# Patient Record
Sex: Female | Born: 1970 | Race: White | Hispanic: No | Marital: Married | Smoking: Former smoker
Health system: Southern US, Community
[De-identification: ages and names within clinical notes are randomized; demographics above are authoritative.]

## PROBLEM LIST (undated history)

## (undated) DIAGNOSIS — I10 Essential (primary) hypertension: Secondary | ICD-10-CM

## (undated) HISTORY — PX: KNEE SURGERY: SHX244

---

## 2007-08-18 ENCOUNTER — Ambulatory Visit: Payer: Self-pay | Admitting: Family Medicine

## 2009-05-12 IMAGING — CT CT HEAD WITHOUT AND WITH CONTRAST
1 of 2 series · 13 of 30 positions shown, 17 images · non-contrast
Comparison: none

REASON FOR EXAM: Weakness
COMMENTS:

[Series 2: soft tissue wo · axial · 0.40mm/px · z∈[+686,+806]mm · 13 of 29 slices shown, 17 images]
[im 3/29  brain]
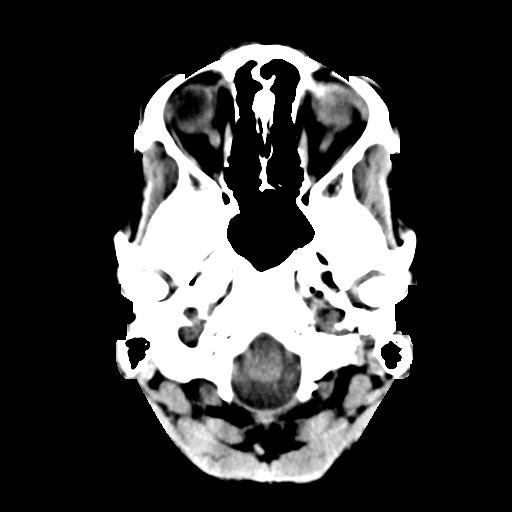
[im 3/29  bone]
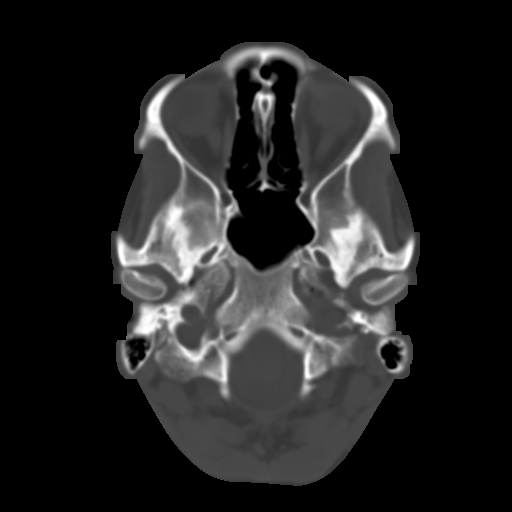
[im 5/29  brain]
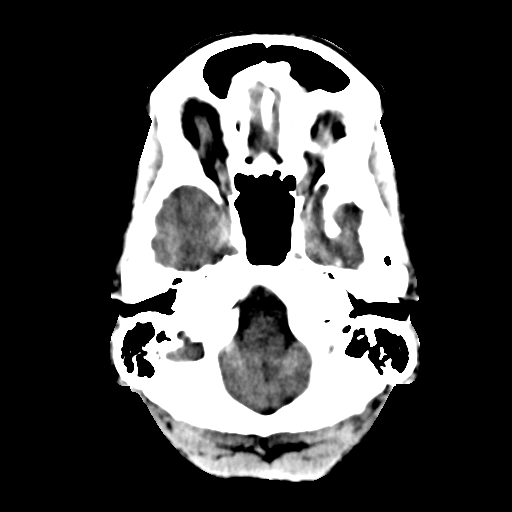
[im 7/29  brain]
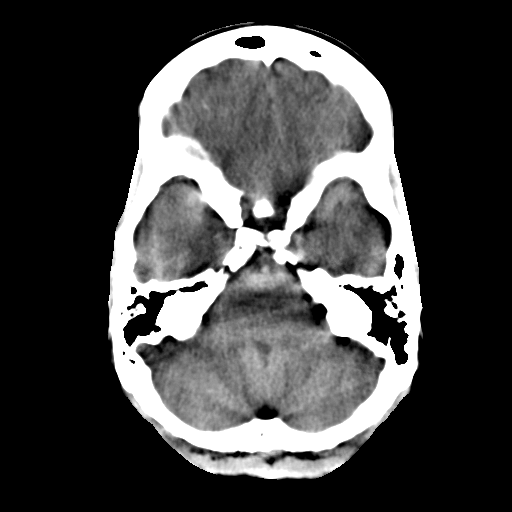
[im 9/29  brain]
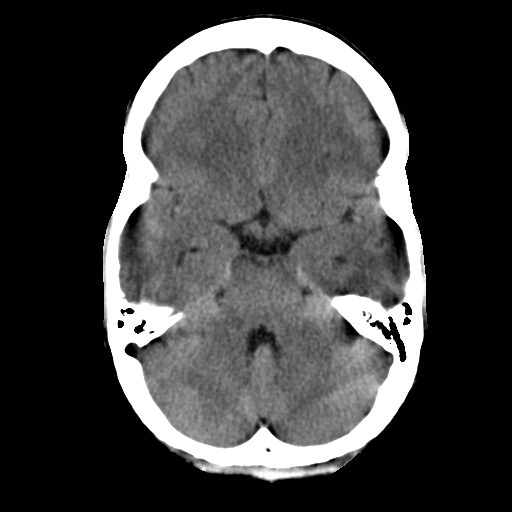
[im 11/29  brain]
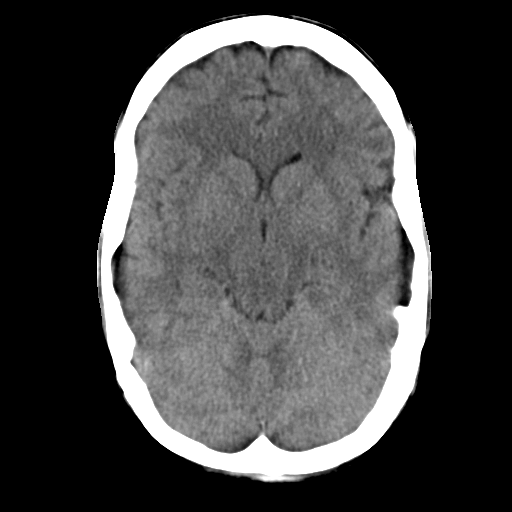
[im 11/29  bone]
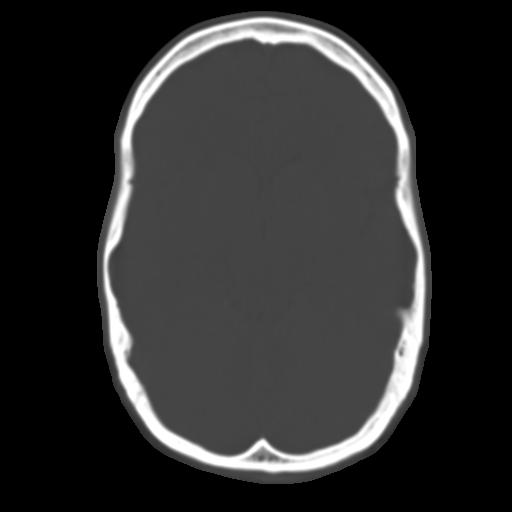
[im 13/29  brain]
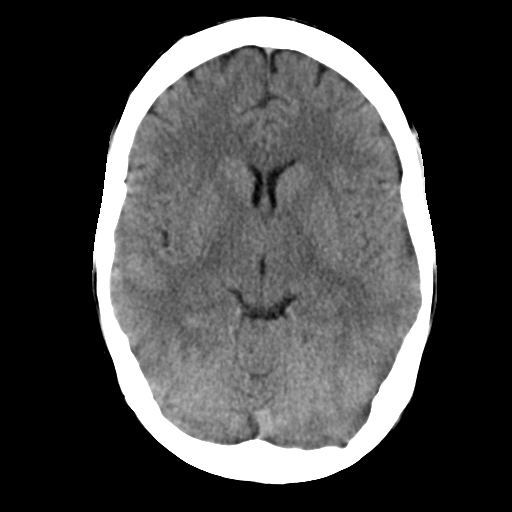
[im 15/29  brain]
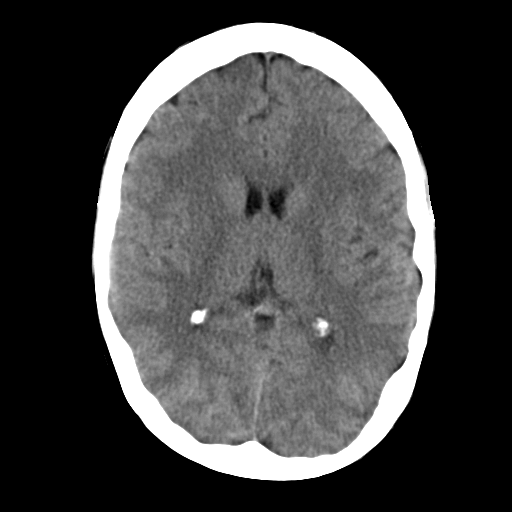
[im 17/29  brain]
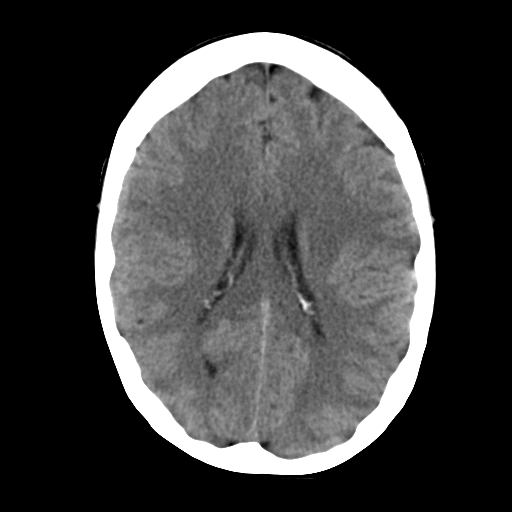
[im 19/29  brain]
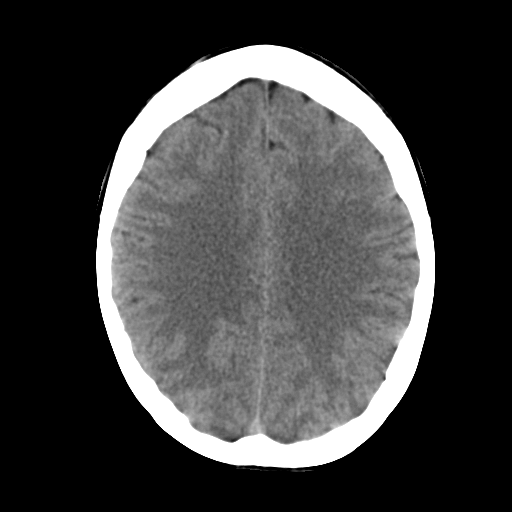
[im 19/29  bone]
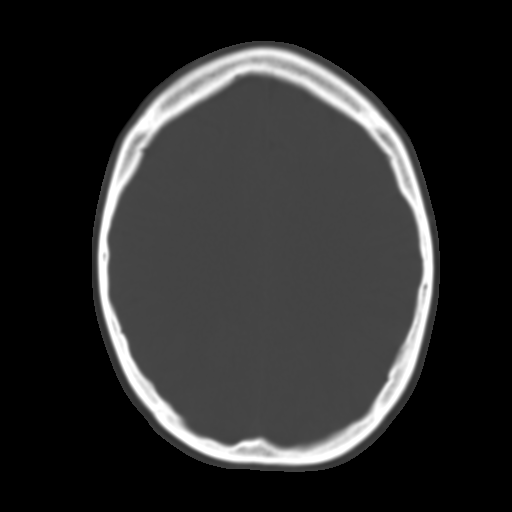
[im 21/29  brain]
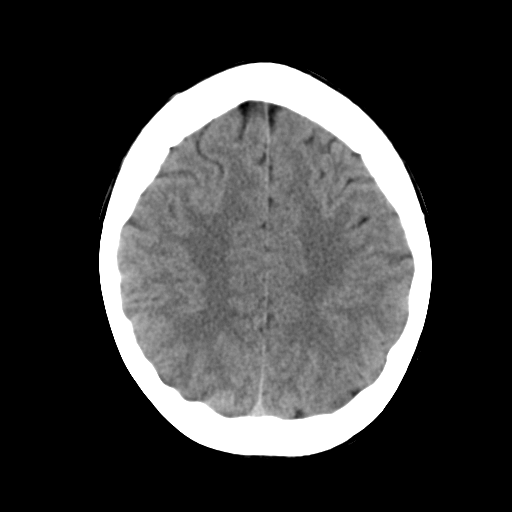
[im 23/29  brain]
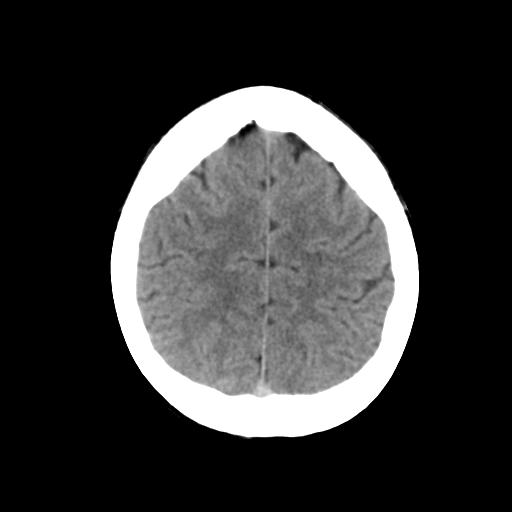
[im 25/29  brain]
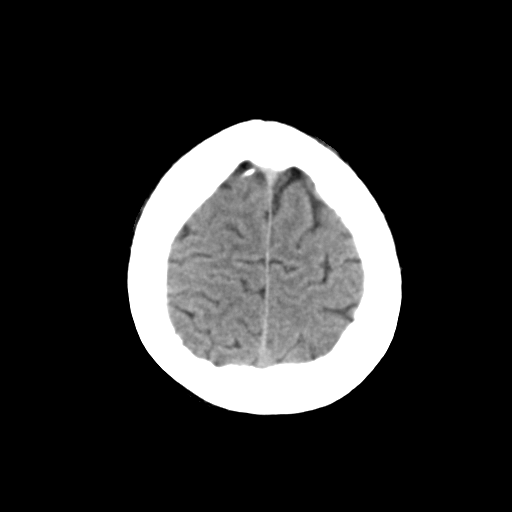
[im 27/29  brain]
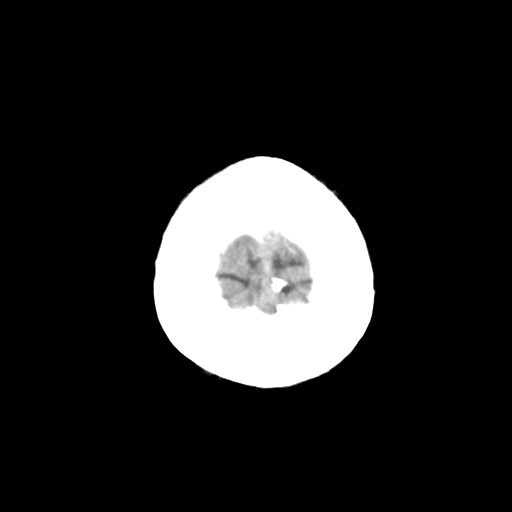
[im 27/29  bone]
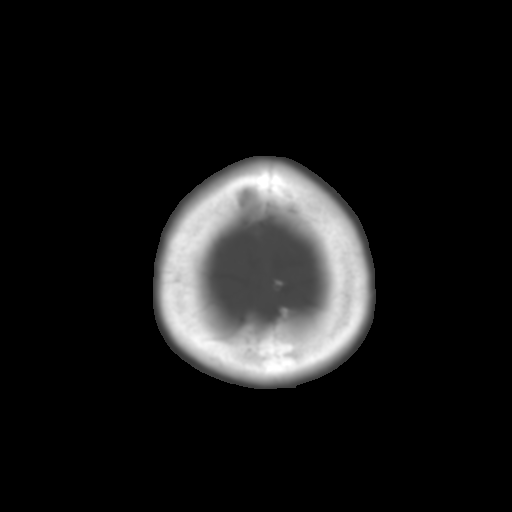

[13 of 30 positions shown; findings below may reference images not displayed]

PROCEDURE:     SOHI - AYA EL NAH/BELLAL  - August 18, 2007  [DATE]

RESULT:     Non-contrast and contrast-enhanced scans were performed. On the
noncontrast images the ventricles are normal in size and position. There is
no intracranial hemorrhage, mass, or mass-effect. The cerebellum and
brainstem exhibit normal density. There are no findings to suggest an
evolving ischemic infarction.

Following intravenous administration of 50 ml of Isovue 300, the enhancement
pattern of the brain parenchyma is normal. At bone window settings the
observed portions of the paranasal sinuses are clear. The mastoid air cells
are well pneumatized. There is no evidence of a skull fracture.
IMPRESSION: Normal non-contrast and contrast-enhanced CT scan of the
brain.

## 2010-07-30 DIAGNOSIS — F329 Major depressive disorder, single episode, unspecified: Secondary | ICD-10-CM | POA: Insufficient documentation

## 2010-07-30 DIAGNOSIS — F988 Other specified behavioral and emotional disorders with onset usually occurring in childhood and adolescence: Secondary | ICD-10-CM | POA: Insufficient documentation

## 2010-07-30 DIAGNOSIS — F411 Generalized anxiety disorder: Secondary | ICD-10-CM | POA: Insufficient documentation

## 2013-09-27 ENCOUNTER — Ambulatory Visit: Payer: Self-pay | Admitting: Emergency Medicine

## 2013-09-29 DIAGNOSIS — I10 Essential (primary) hypertension: Secondary | ICD-10-CM | POA: Insufficient documentation

## 2014-08-26 DIAGNOSIS — Z859 Personal history of malignant neoplasm, unspecified: Secondary | ICD-10-CM | POA: Insufficient documentation

## 2014-08-29 DIAGNOSIS — Z7141 Alcohol abuse counseling and surveillance of alcoholic: Secondary | ICD-10-CM | POA: Insufficient documentation

## 2015-01-02 ENCOUNTER — Other Ambulatory Visit: Payer: Self-pay | Admitting: Family Medicine

## 2015-01-02 DIAGNOSIS — Z1231 Encounter for screening mammogram for malignant neoplasm of breast: Secondary | ICD-10-CM

## 2015-01-05 ENCOUNTER — Ambulatory Visit: Payer: Self-pay | Attending: Family Medicine

## 2015-04-18 ENCOUNTER — Encounter: Payer: Self-pay | Admitting: *Deleted

## 2015-04-18 ENCOUNTER — Ambulatory Visit
Admission: EM | Admit: 2015-04-18 | Discharge: 2015-04-18 | Disposition: A | Discharge status: Home / Self Care | Payer: No Typology Code available for payment source | Attending: Family Medicine | Admitting: Family Medicine

## 2015-04-18 DIAGNOSIS — J0100 Acute maxillary sinusitis, unspecified: Secondary | ICD-10-CM | POA: Diagnosis not present

## 2015-04-18 HISTORY — DX: Essential (primary) hypertension: I10

## 2015-04-18 MED ORDER — AMOXICILLIN 875 MG PO TABS: 875.0000 mg | ORAL_TABLET | Freq: Two times a day (BID) | ORAL | Status: DC

## 2015-04-18 NOTE — ED Notes (Signed)
C/o head and chest congestion and pressure sensation in her ears along with tinnitus x3 weeks. Also has at times had shortness of breath and had a sore throat.

## 2015-04-18 NOTE — ED Provider Notes (Signed)
CSN: [id number]     Arrival date & time 04/18/15  [id number] History   First MD Initiated Contact with Patient 04/18/15 [phone]     Chief Complaint  Patient presents with  . Nasal Congestion  . Shortness of Breath  . Tinnitus   (Consider location/radiation/quality/duration/timing/severity/associated sxs/prior Treatment) Patient is a 45 y.o. female presenting with shortness of breath and URI. The history is provided by the patient.  Shortness of Breath Associated symptoms: cough and headaches   Associated symptoms: no wheezing   URI Presenting symptoms: congestion, cough and facial pain   Severity:  Moderate Onset quality:  Sudden Duration:  3 weeks Timing:  Constant Progression:  Worsening Chronicity:  New Relieved by:  Nothing Ineffective treatments:  OTC medications Associated symptoms: headaches and sinus pain   Associated symptoms: no wheezing   Risk factors: not elderly, no chronic cardiac disease, no chronic kidney disease, no chronic respiratory disease, no diabetes mellitus, no immunosuppression, no recent illness, no recent travel and no sick contacts     Past Medical History  Diagnosis Date  . Hypertension    History reviewed. No pertinent past surgical history. History reviewed. No pertinent family history. Social History  Substance Use Topics  . Smoking status: Former Games developermoker  . Smokeless tobacco: None  . Alcohol Use: No   OB History    No data available     Review of Systems  HENT: Positive for congestion.   Respiratory: Positive for cough and shortness of breath. Negative for wheezing.   Neurological: Positive for headaches.    Allergies  Review of patient's allergies indicates no known allergies.  Home Medications   Prior to Admission medications   Medication Sig Start Date End Date Taking? Authorizing Provider  amLODipine (NORVASC) 10 MG tablet Take 10 mg by mouth daily.   Yes Historical Provider, MD  amphetamine-dextroamphetamine (ADDERALL) 20 MG  tablet Take 20 mg by mouth daily.   Yes Historical Provider, MD  lisinopril (PRINIVIL,ZESTRIL) 20 MG tablet Take 20 mg by mouth daily.   Yes Historical Provider, MD  venlafaxine (EFFEXOR) 37.5 MG tablet Take 37.5 mg by mouth 2 (two) times daily.   Yes Historical Provider, MD  amoxicillin (AMOXIL) 875 MG tablet Take 1 tablet (875 mg total) by mouth 2 (two) times daily. 04/18/15   Payton Mccallumrlando Affan Callow, MD   Meds Ordered and Administered this Visit  Medications - No data to display  BP 122/82 mmHg  Pulse 102  Temp(Src) 98.5 F (36.9 C) (Oral)  Resp 16  Ht 5\' 4"  (1.626 m)  Wt 160 lb (72.576 kg)  BMI 27.45 kg/m2  SpO2 99% No data found.   Physical Exam  Constitutional: She appears well-developed and well-nourished. No distress.  HENT:  Head: Normocephalic and atraumatic.  Right Ear: Tympanic membrane, external ear and ear canal normal.  Left Ear: Tympanic membrane, external ear and ear canal normal.  Nose: Mucosal edema and rhinorrhea present. No nose lacerations, sinus tenderness, nasal deformity, septal deviation or nasal septal hematoma. No epistaxis.  No foreign bodies. Right sinus exhibits maxillary sinus tenderness and frontal sinus tenderness. Left sinus exhibits maxillary sinus tenderness and frontal sinus tenderness.  Mouth/Throat: Uvula is midline, oropharynx is clear and moist and mucous membranes are normal. No oropharyngeal exudate.  Eyes: Conjunctivae and EOM are normal. Pupils are equal, round, and reactive to light. Right eye exhibits no discharge. Left eye exhibits no discharge. No scleral icterus.  Neck: Normal range of motion. Neck supple. No thyromegaly present.  Cardiovascular: Normal  rate, regular rhythm and normal heart sounds.   Pulmonary/Chest: Effort normal and breath sounds normal. No respiratory distress. She has no wheezes. She has no rales.  Lymphadenopathy:    She has no cervical adenopathy.  Skin: She is not diaphoretic.  Nursing note and vitals reviewed.   ED  Course  Procedures (including critical care time)  Labs Review Labs Reviewed - No data to display  Imaging Review No results found.   Visual Acuity Review  Right Eye Distance:   Left Eye Distance:   Bilateral Distance:    Right Eye Near:   Left Eye Near:    Bilateral Near:         MDM   1. Acute maxillary sinusitis, recurrence not specified    New Prescriptions   AMOXICILLIN (AMOXIL) 875 MG TABLET    Take 1 tablet (875 mg total) by mouth 2 (two) times daily.     1.diagnosis reviewed with patient 2. rx as per orders above; reviewed possible side effects, interactions, risks and benefits  3. Recommend supportive treatment with rest, increased fluids, otc analgesics 4. Follow-up prn if symptoms worsen or don't improve  Payton Mccallum, MD 04/18/15 [phone]

## 2016-01-16 DIAGNOSIS — B001 Herpesviral vesicular dermatitis: Secondary | ICD-10-CM | POA: Insufficient documentation

## 2017-09-14 ENCOUNTER — Ambulatory Visit
Admission: EM | Admit: 2017-09-14 | Discharge: 2017-09-14 | Disposition: A | Discharge status: Home / Self Care | Payer: 59 | Attending: Internal Medicine | Admitting: Internal Medicine

## 2017-09-14 ENCOUNTER — Other Ambulatory Visit: Payer: Self-pay

## 2017-09-14 DIAGNOSIS — N3090 Cystitis, unspecified without hematuria: Secondary | ICD-10-CM

## 2017-09-14 LAB — URINALYSIS, COMPLETE (UACMP) WITH MICROSCOPIC
Bilirubin Urine: NEGATIVE
Glucose, UA: NEGATIVE mg/dL
Hgb urine dipstick: NEGATIVE
Ketones, ur: NEGATIVE mg/dL
Nitrite: NEGATIVE
Protein, ur: NEGATIVE mg/dL
Specific Gravity, Urine: 1.015 (ref 1.005–1.030)
pH: 8.5 — ABNORMAL HIGH (ref 5.0–8.0)

## 2017-09-14 MED ORDER — SULFAMETHOXAZOLE-TRIMETHOPRIM 800-160 MG PO TABS: 1.0000 | ORAL_TABLET | Freq: Two times a day (BID) | ORAL | 0 refills | Status: AC

## 2017-09-14 MED ORDER — FLUCONAZOLE 150 MG PO TABS: 150.0000 mg | ORAL_TABLET | Freq: Every day | ORAL | 1 refills | Status: DC

## 2017-09-14 NOTE — ED Provider Notes (Signed)
[facility]    CSN: [id number] Arrival date & time: 09/14/17  1221     History   Chief Complaint Chief Complaint  Patient presents with  . Urinary Frequency    HPI Lindsey Vasquez is a 47 y.o. female.   Pt c/o urinary frequency and back pain she describes like menstrual aches. Occasional brief dysuria x10 days. Denies fever, nausea, vomiting or diarrhea     Past Medical History:  Diagnosis Date  . Hypertension     There are no active problems to display for this patient.   Past Surgical History:  Procedure Laterality Date  . CESAREAN SECTION  10/1992  . KNEE SURGERY Left     OB History   None      Home Medications    Prior to Admission medications   Medication Sig Start Date End Date Taking? Authorizing Provider  amphetamine-dextroamphetamine (ADDERALL) 20 MG tablet Take 20 mg by mouth daily.   Yes [provider]  hydrochlorothiazide (MICROZIDE) 12.5 MG capsule Take by mouth. 05/14/16 08/09/18 Yes [provider]  venlafaxine (EFFEXOR) 37.5 MG tablet Take 37.5 mg by mouth 2 (two) times daily.   Yes [provider]  amLODipine (NORVASC) 10 MG tablet Take 10 mg by mouth daily.    [provider]  amoxicillin (AMOXIL) 875 MG tablet Take 1 tablet (875 mg total) by mouth 2 (two) times daily. 04/18/15   Payton Mccallumonty, [place], MD  fluconazole (DIFLUCAN) 150 MG tablet Take 1 tablet (150 mg total) by mouth daily. Take 1 tab before your course of antibiotics, then 1 tab upon completion of antibiotics 09/14/17   Arnaldo Nataliamond, Abie Killian S, MD  lisinopril (PRINIVIL,ZESTRIL) 20 MG tablet Take 20 mg by mouth daily.    [provider]  sulfamethoxazole-trimethoprim (BACTRIM DS,SEPTRA DS) 800-160 MG tablet Take 1 tablet by mouth 2 (two) times daily for 3 days. 09/14/17 09/17/17  Arnaldo Nataliamond, Monserrath Junio S, MD    Family History History reviewed. No pertinent family history.  Social History Social History   Tobacco Use  . Smoking status:  Former Games developermoker  . Smokeless tobacco: Never Used  Substance Use Topics  . Alcohol use: No  . Drug use: Not Currently     Allergies   Patient has no known allergies.   Review of Systems Review of Systems  Constitutional: Negative for chills and fever.  HENT: Negative for sore throat and tinnitus.   Eyes: Negative for redness.  Respiratory: Negative for cough and shortness of breath.   Cardiovascular: Negative for chest pain and palpitations.  Gastrointestinal: Negative for abdominal pain, diarrhea, nausea and vomiting.  Genitourinary: Positive for frequency. Negative for dysuria and urgency.  Musculoskeletal: Negative for myalgias.  Skin: Negative for rash.       No lesions  Neurological: Negative for weakness.  Hematological: Does not bruise/bleed easily.  Psychiatric/Behavioral: Negative for suicidal ideas.     Physical Exam Triage Vital Signs ED Triage Vitals  Enc Vitals Group     BP 09/14/17 1243 (!) 140/102     Pulse Rate 09/14/17 1243 76     Resp --      Temp 09/14/17 1243 98.6 F (37 C)     Temp Source 09/14/17 1243 Oral     SpO2 09/14/17 1243 97 %     Weight 09/14/17 1240 170 lb (77.1 kg)     Height 09/14/17 1240 5\' 3"  (1.6 m)     Head Circumference --      Peak Flow --  Pain Score 09/14/17 1239 5     Pain Loc --      Pain Edu? --      Excl. in GC? --    No data found.  Updated Vital Signs BP (!) 140/102 (BP Location: Right Arm)   Pulse 76   Temp 98.6 F (37 C) (Oral)   Ht 5\' 3"  (1.6 m)   Wt 77.1 kg   SpO2 97%   BMI 30.11 kg/m   Visual Acuity Right Eye Distance:   Left Eye Distance:   Bilateral Distance:    Right Eye Near:   Left Eye Near:    Bilateral Near:     Physical Exam  Constitutional: She is oriented to person, place, and time. She appears well-developed and well-nourished. No distress.  HENT:  Head: Normocephalic and atraumatic.  Mouth/Throat: Oropharynx is clear and moist.  Eyes: Pupils are equal, round, and reactive to  light. Conjunctivae and EOM are normal. No scleral icterus.  Neck: Normal range of motion. Neck supple. No JVD present. No tracheal deviation present. No thyromegaly present.  Cardiovascular: Normal rate, regular rhythm and normal heart sounds. Exam reveals no gallop and no friction rub.  No murmur heard. Pulmonary/Chest: Effort normal and breath sounds normal.  Abdominal: Soft. Bowel sounds are normal. She exhibits no distension. There is no tenderness.  No CVA tenderness  Musculoskeletal: Normal range of motion. She exhibits no edema.  Lymphadenopathy:    She has no cervical adenopathy.  Neurological: She is alert and oriented to person, place, and time. No cranial nerve deficit.  Skin: Skin is warm and dry.  Psychiatric: She has a normal mood and affect. Her behavior is normal. Judgment and thought content normal.  Nursing note and vitals reviewed.    UC Treatments / Results  Labs (all labs ordered are listed, but only abnormal results are displayed) Labs Reviewed  URINALYSIS, COMPLETE (UACMP) WITH MICROSCOPIC - Abnormal; Notable for the following components:      Result Value   pH 8.5 (*)    Leukocytes, UA TRACE (*)    Bacteria, UA RARE (*)    All other components within normal limits    EKG None  Radiology No results found.  Procedures Procedures (including critical care time)  Medications Ordered in UC Medications - No data to display  Initial Impression / Assessment and Plan / UC Course  I have reviewed the triage vital signs and the nursing notes.  Pertinent labs & imaging results that were available during my care of the patient were reviewed by me and considered in my medical decision making (see chart for details).     Clinically UTI although UA is not impressive.   Final Clinical Impressions(s) / UC Diagnoses   Final diagnoses:  Cystitis   Discharge Instructions   None    ED Prescriptions    Medication Sig Dispense Auth. Provider    sulfamethoxazole-trimethoprim (BACTRIM DS,SEPTRA DS) 800-160 MG tablet Take 1 tablet by mouth 2 (two) times daily for 3 days. 6 tablet Arnaldo Nataliamond, Heather Streeper S, MD   fluconazole (DIFLUCAN) 150 MG tablet Take 1 tablet (150 mg total) by mouth daily. Take 1 tab before your course of antibiotics, then 1 tab upon completion of antibiotics 2 tablet Arnaldo Nataliamond, Tanner Vigna S, MD     Controlled Substance Prescriptions [place] Controlled Substance Registry consulted? Not Applicable   Arnaldo Nataliamond, Zakery Normington S, MD 09/14/17 1424

## 2017-09-14 NOTE — ED Triage Notes (Signed)
Patient complains of urinary frequency, urgency, burning with urination and low back pain x 10 days.

## 2017-11-03 ENCOUNTER — Other Ambulatory Visit: Payer: Self-pay | Admitting: Obstetrics and Gynecology

## 2017-11-03 DIAGNOSIS — Z1231 Encounter for screening mammogram for malignant neoplasm of breast: Secondary | ICD-10-CM

## 2017-11-12 ENCOUNTER — Inpatient Hospital Stay: Admission: RE | Admit: 2017-11-12 | Payer: 59 | Source: Ambulatory Visit

## 2020-12-29 ENCOUNTER — Other Ambulatory Visit: Payer: Self-pay

## 2020-12-29 ENCOUNTER — Ambulatory Visit
Admission: EM | Admit: 2020-12-29 | Discharge: 2020-12-29 | Disposition: A | Discharge status: Home / Self Care | Payer: No Typology Code available for payment source | Attending: Emergency Medicine | Admitting: Emergency Medicine

## 2020-12-29 DIAGNOSIS — U071 COVID-19: Secondary | ICD-10-CM | POA: Diagnosis not present

## 2020-12-29 MED ORDER — LIDOCAINE VISCOUS HCL 2 % MT SOLN: 10.0000 mL | OROMUCOSAL | 0 refills | Status: DC | PRN

## 2020-12-29 NOTE — ED Triage Notes (Signed)
Pt here with C/O testing positive at home with Covid yesterday. Returned from [place] on Wednesday started having a sore throat Wednesday morning and has since got worst, cough, sore throat.

## 2020-12-29 NOTE — ED Provider Notes (Signed)
[facility]  ____________________________________________  Time seen: Approximately 9:37 AM  I have reviewed the triage vital signs and the nursing notes.   HISTORY  Chief Complaint Covid Positive    HPI Lindsey Vasquez is a 50 y.o. female who presents the emergency department complaining of nasal congestion, sore throat, cough, body aches.  No emesis or diarrhea.  Patient states that she developed symptoms 2 days ago, took an at home COVID test which was positive.  Patient just traveled from another state.  Patient is requesting symptom control medication at this time.  No neck pain or stiffness, chest pain, shortness of breath.       Past Medical History:  Diagnosis Date   Hypertension     There are no problems to display for this patient.   Past Surgical History:  Procedure Laterality Date   CESAREAN SECTION  10/1992   KNEE SURGERY Left     Prior to Admission medications   Medication Sig Start Date End Date Taking? Authorizing Provider  amLODipine (NORVASC) 10 MG tablet Take 10 mg by mouth daily.   Yes [provider]  amphetamine-dextroamphetamine (ADDERALL) 20 MG tablet Take 20 mg by mouth daily.   Yes [provider]  hydrochlorothiazide (MICROZIDE) 12.5 MG capsule Take by mouth. 05/14/16 12/29/20 Yes [provider]  lidocaine (XYLOCAINE) 2 % solution Use as directed 10 mLs in the mouth or throat every 4 (four) hours as needed for mouth pain. Gargle then swallow 12/29/20  Yes Khayden Herzberg, Delorise Royals, PA-C  venlafaxine (EFFEXOR) 37.5 MG tablet Take 37.5 mg by mouth 2 (two) times daily.   Yes [provider]  amoxicillin (AMOXIL) 875 MG tablet Take 1 tablet (875 mg total) by mouth 2 (two) times daily. 04/18/15   Payton Mccallum, MD  fluconazole (DIFLUCAN) 150 MG tablet Take 1 tablet (150 mg total) by mouth daily. Take 1 tab before your course of antibiotics, then 1 tab upon completion of antibiotics 09/14/17   Arnaldo Natal, MD   lisinopril (PRINIVIL,ZESTRIL) 20 MG tablet Take 20 mg by mouth daily.    [provider]    Allergies Patient has no known allergies.  History reviewed. No pertinent family history.  Social History Social History   Tobacco Use   Smoking status: Former   Smokeless tobacco: Never  Building services engineer Use: Never used  Substance Use Topics   Alcohol use: No   Drug use: Not Currently     Review of Systems  Constitutional: Positive fever/chills.  Positive for body aches Eyes: No visual changes. No discharge ENT: Positive for nasal congestion and sore throat Cardiovascular: no chest pain. Respiratory: Positive cough. No SOB. Gastrointestinal: No abdominal pain.  No nausea, no vomiting.  No diarrhea.  No constipation. Musculoskeletal: Negative for musculoskeletal pain. Skin: Negative for rash, abrasions, lacerations, ecchymosis. Neurological: Negative for headaches, focal weakness or numbness.  10 System ROS otherwise negative.  ____________________________________________   PHYSICAL EXAM:  VITAL SIGNS: ED Triage Vitals  Enc Vitals Group     BP 12/29/20 0901 125/86     Pulse Rate 12/29/20 0901 80     Resp 12/29/20 0901 18     Temp 12/29/20 0901 98.9 F (37.2 C)     Temp Source 12/29/20 0901 Oral     SpO2 12/29/20 0901 98 %     Weight 12/29/20 0900 190 lb (86.2 kg)     Height 12/29/20 0900 5\' 4"  (1.626 m)     Head Circumference --  Peak Flow --      Pain Score 12/29/20 0859 8     Pain Loc --      Pain Edu? --      Excl. in GC? --      Constitutional: Alert and oriented. Well appearing and in no acute distress. Eyes: Conjunctivae are normal. PERRL. EOMI. Head: Atraumatic. ENT:      Ears: EACs and TMs unremarkable bilaterally      Nose: Mild clear congestion/rhinnorhea.      Mouth/Throat: Mucous membranes are moist.  Slight erythema in the oropharynx but no edema.  No exudates.  Uvula is midline Neck: No stridor.  Neck is supple full range of  motion.  No tenderness on exam. Hematological/Lymphatic/Immunilogical: No cervical lymphadenopathy. Cardiovascular: Normal rate, regular rhythm. Normal S1 and S2.  Good peripheral circulation. Respiratory: Normal respiratory effort without tachypnea or retractions. Lungs CTAB. Good air entry to the bases with no decreased or absent breath sounds. Musculoskeletal: Full range of motion to all extremities. No gross deformities appreciated. Neurologic:  Normal speech and language. No gross focal neurologic deficits are appreciated.  Skin:  Skin is warm, dry and intact. No rash noted. Psychiatric: Mood and affect are normal. Speech and behavior are normal. Patient exhibits appropriate insight and judgement.   ____________________________________________   LABS (all labs ordered are listed, but only abnormal results are displayed)  Labs Reviewed - No data to display ____________________________________________  EKG   ____________________________________________  RADIOLOGY   No results found.  ____________________________________________    PROCEDURES  Procedure(s) performed:    Procedures    Medications - No data to display   ____________________________________________   INITIAL IMPRESSION / ASSESSMENT AND PLAN / ED COURSE  Pertinent labs & imaging results that were available during my care of the patient were reviewed by me and considered in my medical decision making (see chart for details).  Review of the [place] CSRS was performed in accordance of the [facility] prior to dispensing any controlled drugs.           Patient's diagnosis is consistent with COVID-19.  Patient presents urgent care with at home positive COVID test.  Patient is using over-the-counter medications.  Discussed medications with the patient.  She declines antiviral maxillary prescription which I feel is reasonable.  Discussed additional symptom control.  Patient is primarily looking for something for  her sore throat.  Will prescribe viscous lidocaine for her.  Return precautions discussed with the patient.  Otherwise follow-up primary care..  Patient is given ED precautions to return to the ED for any worsening or new symptoms.     ____________________________________________  FINAL CLINICAL IMPRESSION(S) / DIAGNOSES  Final diagnoses:  COVID-19      NEW MEDICATIONS STARTED DURING THIS VISIT:  ED Discharge Orders          Ordered    lidocaine (XYLOCAINE) 2 % solution  Every 4 hours PRN        12/29/20 0948                This chart was dictated using voice recognition software/Dragon. Despite best efforts to proofread, errors can occur which can change the meaning. Any change was purely unintentional.    Racheal Patches, PA-C 12/29/20 [phone]

## 2022-09-09 DIAGNOSIS — N951 Menopausal and female climacteric states: Secondary | ICD-10-CM | POA: Insufficient documentation

## 2024-02-18 ENCOUNTER — Ambulatory Visit: Admission: RE | Admit: 2024-02-18 | Discharge: 2024-02-18 | Disposition: A | Discharge status: Home / Self Care

## 2024-02-18 VITALS — BP 120/82 | HR 76 | Temp 97.7°F | Resp 19 | Wt 154.5 lb

## 2024-02-18 DIAGNOSIS — I10 Essential (primary) hypertension: Secondary | ICD-10-CM

## 2024-02-18 DIAGNOSIS — F419 Anxiety disorder, unspecified: Secondary | ICD-10-CM | POA: Diagnosis not present

## 2024-02-18 DIAGNOSIS — Z760 Encounter for issue of repeat prescription: Secondary | ICD-10-CM

## 2024-02-18 MED ORDER — VENLAFAXINE HCL ER 75 MG PO CP24: 75.0000 mg | ORAL_CAPSULE | Freq: Every day | ORAL | 1 refills | Status: AC

## 2024-02-18 MED ORDER — HYDROCHLOROTHIAZIDE 12.5 MG PO CAPS: 12.5000 mg | ORAL_CAPSULE | Freq: Every day | ORAL | 1 refills | Status: AC

## 2024-02-18 MED ORDER — AMLODIPINE BESYLATE 5 MG PO TABS: 5.0000 mg | ORAL_TABLET | Freq: Every day | ORAL | 1 refills | Status: AC

## 2024-02-18 NOTE — ED Provider Notes (Signed)
 " [facility]    CSN: [id number] Arrival date & time: 02/18/24  1411      History   Chief Complaint Chief Complaint  Patient presents with   Medication Refill    I have prescriptions for hypertension that I need a refill on as I wait for an appointment with a general practitioner in the area. - Entered by patient    HPI Lindsey Vasquez is a 54 y.o. female presenting for multiple medication refills including amlodipine , hydrochlorothiazide  and venlafaxine . She has been on venlafaxine  for over 10 years and says it manages her anxiety well. Patient last seen by PCP 6 months ago.  States her insurance recently changed and she needs to change medical groups.  States that will be a couple months until she can see a PCP in the area.  Blood pressure has been normal when checked at home.  It is 120/82 today.  No concerns.  Patient doing well.  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      Home Medications    Prior to Admission medications  Medication Sig Start Date End Date Taking? Authorizing Provider  amLODipine  (NORVASC ) 5 MG tablet Take 1 tablet (5 mg total) by mouth daily. 02/18/24  Yes Arvis Jolan NOVAK, PA-C  estradiol (ESTRACE) 1 MG tablet Take 1 mg by mouth. 08/28/23  Yes [provider]  hydrochlorothiazide  (MICROZIDE ) 12.5 MG capsule Take 1 capsule (12.5 mg total) by mouth daily. 02/18/24  Yes Arvis Jolan NOVAK, PA-C  venlafaxine  XR  (EFFEXOR  XR) 75 MG 24 hr capsule Take 1 capsule (75 mg total) by mouth daily with breakfast. 02/18/24  Yes Arvis Jolan B, PA-C  amphetamine-dextroamphetamine (ADDERALL) 20 MG tablet Take 20 mg by mouth daily.    [provider]  progesterone (PROMETRIUM) 100 MG capsule Take 100 mg by mouth daily.    [provider]    Family History History reviewed. No pertinent family history.  Social History Social History[1]   Allergies   Patient has no known allergies.   Review of Systems Review of Systems  Constitutional:  Negative for fatigue.  Respiratory:  Negative for shortness of breath.   Cardiovascular:  Negative for chest pain and palpitations.  Neurological:  Negative for dizziness, weakness, numbness and headaches.  Psychiatric/Behavioral:  Negative for dysphoric mood. The patient is not nervous/anxious.      Physical Exam Triage Vital Signs ED Triage Vitals  Encounter Vitals Group     BP      Girls Systolic BP Percentile      Girls Diastolic BP Percentile      Boys Systolic BP Percentile      Boys Diastolic BP Percentile      Pulse      Resp      Temp      Temp src      SpO2      Weight  Height      Head Circumference      Peak Flow      Pain Score      Pain Loc      Pain Education      Exclude from Growth Chart    No data found.  Updated Vital Signs BP 120/82   Pulse 76   Temp 97.7 F (36.5 C)   Resp 19   Wt 154 lb 8 oz (70.1 kg)   SpO2 97%   BMI 26.52 kg/m      Physical Exam Vitals and nursing note reviewed.  Constitutional:      General: She is not in acute distress.    Appearance: Normal appearance. She is not ill-appearing or toxic-appearing.  HENT:     Head: Normocephalic and atraumatic.  Eyes:     General: No scleral icterus.       Right eye: No discharge.        Left eye: No discharge.     Conjunctiva/sclera: Conjunctivae normal.  Cardiovascular:     Rate and Rhythm: Normal rate and regular rhythm.     Heart  sounds: Normal heart sounds.  Pulmonary:     Effort: Pulmonary effort is normal. No respiratory distress.     Breath sounds: Normal breath sounds.  Musculoskeletal:     Cervical back: Neck supple.  Skin:    General: Skin is dry.  Neurological:     General: No focal deficit present.     Mental Status: She is alert. Mental status is at baseline.     Motor: No weakness.     Gait: Gait normal.  Psychiatric:        Mood and Affect: Mood normal.        Behavior: Behavior normal.      UC Treatments / Results  Labs (all labs ordered are listed, but only abnormal results are displayed) Labs Reviewed - No data to display  EKG   Radiology No results found.  Procedures Procedures (including critical care time)  Medications Ordered in UC Medications - No data to display  Initial Impression / Assessment and Plan / UC Course  I have reviewed the triage vital signs and the nursing notes.  Pertinent labs & imaging results that were available during my care of the patient were reviewed by me and considered in my medical decision making (see chart for details).   54 year old female presents for medication refills for hypertension and anxiety.  Takes amlodipine , HCTZ for BP and venlafaxine  for anxiety.  Last seen by PCP 6 months ago.  It has been about 2 years since she has had lab work.  She has no upcoming appointments with primary care as she has not established with anyone in the area yet.  Vitals normal and stable.  Overall well-appearing.  Heart regular rate and rhythm.  Chest clear.  Refilled venlafaxine , amlodipine  and HCTZ.  Encouraged her to make PCP follow-up ASAP for additional refills.   Final Clinical Impressions(s) / UC Diagnoses   Final diagnoses:  Medication refill  Essential hypertension  Anxiety     Discharge Instructions      - Filled your blood pressure medication and anxiety medication. - Try to follow-up with PCP as soon as possible for additional  refills. - Return here for acute/urgent care as needed.     ED Prescriptions     Medication Sig Dispense Auth. Provider   venlafaxine  XR (EFFEXOR  XR) 75 MG 24 hr capsule Take 1 capsule (  75 mg total) by mouth daily with breakfast. 30 capsule Arvis Huxley B, PA-C   amLODipine  (NORVASC ) 5 MG tablet Take 1 tablet (5 mg total) by mouth daily. 30 tablet Arvis Huxley B, PA-C   hydrochlorothiazide  (MICROZIDE ) 12.5 MG capsule Take 1 capsule (12.5 mg total) by mouth daily. 30 capsule Arvis Huxley NOVAK, PA-C      PDMP not reviewed this encounter.     [1]  Social History Tobacco Use   Smoking status: Former   Smokeless tobacco: Never  Vaping Use   Vaping status: Never Used  Substance Use Topics   Alcohol use: No   Drug use: Not Currently     Arvis Huxley NOVAK DEVONNA 02/18/24 1446  "

## 2024-02-18 NOTE — Discharge Instructions (Addendum)
-   Filled your blood pressure medication and anxiety medication. - Try to follow-up with PCP as soon as possible for additional refills. - Return here for acute/urgent care as needed.

## 2024-02-18 NOTE — ED Triage Notes (Signed)
 Patient to Urgent Care with complaints of    Amlodipine  10mg / hydrochlorothiazide  12.5mg / venlafaxine  37.5mg / estradiol 1mg / progestrone 100mg   No missed doses.
